# Patient Record
Sex: Female | Born: 2005
Health system: Southern US, Community
[De-identification: ages and names within clinical notes are randomized; demographics above are authoritative.]

## PROBLEM LIST (undated history)

## (undated) DIAGNOSIS — K589 Irritable bowel syndrome without diarrhea: Secondary | ICD-10-CM

## (undated) HISTORY — PX: WISDOM TOOTH EXTRACTION: SHX21

## (undated) HISTORY — PX: NO PAST SURGERIES: SHX2092

---
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
## 2023-02-09 ENCOUNTER — Other Ambulatory Visit: Payer: Self-pay | Admitting: Obstetrics and Gynecology

## 2023-02-26 ENCOUNTER — Telehealth: Payer: Self-pay

## 2023-02-26 ENCOUNTER — Other Ambulatory Visit: Payer: Self-pay | Admitting: Obstetrics and Gynecology

## 2023-02-26 MED ORDER — NORETHINDRONE 0.35 MG PO TABS: 1.0000 | ORAL_TABLET | Freq: Every day | ORAL | 0 refills | Status: DC

## 2023-02-26 NOTE — Telephone Encounter (Signed)
Patient called about OCP refills. Looks like she has an IUD. OCP were recommended for cycle control. Could you please send script to pharmacy.  CB

## 2023-02-26 NOTE — Progress Notes (Signed)
Rx RF POPs for cycle control with IUD

## 2023-02-26 NOTE — Telephone Encounter (Signed)
Rx RF eRxd.  

## 2023-02-27 NOTE — Telephone Encounter (Signed)
Tried both numbers again, no answer, did not leave voice msg this time.

## 2023-02-27 NOTE — Telephone Encounter (Signed)
Called pt on both #'s in chart, no answer, LVMTRC on both #'s.

## 2023-05-13 ENCOUNTER — Telehealth: Payer: Self-pay

## 2023-05-13 NOTE — Telephone Encounter (Signed)
Pt calling; was recently put on a new bcp in addition to her IUD; it was supposed to stop her bleeding; is still having period and bleeding  [phone]

## 2023-05-14 NOTE — Telephone Encounter (Signed)
When first started [facility] pills no periods, then went to beach about 3 months ago she forgot her pills (missed 3 pills) and since then this has been happening. Monthly cycles are lasting 5-6 days. Since those 3 missed pills she has been taking them consistenly every day at 8:00 pm.

## 2023-05-14 NOTE — Telephone Encounter (Signed)
Is her period still lasting 2-3 weeks or is it back to normal length of flow, still monthly?

## 2023-05-14 NOTE — Telephone Encounter (Signed)
Monthly periods with IUD are normal, even with prog only pills. Was trying to stop the 2-3 wk periods. If having monthly periods and no BTB, can stop POPs.

## 2023-05-18 NOTE — Telephone Encounter (Signed)
Called pt, no answer, LVMTRC. 

## 2023-05-19 NOTE — Telephone Encounter (Signed)
Pt aware.

## 2023-07-21 ENCOUNTER — Telehealth: Payer: Self-pay

## 2023-07-21 DIAGNOSIS — N939 Abnormal uterine and vaginal bleeding, unspecified: Secondary | ICD-10-CM

## 2023-07-21 NOTE — Telephone Encounter (Signed)
Options are to remove the IUD and do different BC vs restart POPs to help bleeding with IUD. Had GYN u/s 2/24 and IUD in correct location.

## 2023-07-21 NOTE — Telephone Encounter (Signed)
Pt calling; [facility]'s pt; has IUD; a few months ago she was put on a pill as well d/t excessive bleeding; she stopped the pill 1-2 months ago and the excessive bleeding is back.  What to do?  Adv I will send msg to [facility].  Pharm correct in chart.  [phone]

## 2023-07-22 MED ORDER — NORETHINDRONE 0.35 MG PO TABS: 1.0000 | ORAL_TABLET | Freq: Every day | ORAL | 0 refills | Status: DC

## 2023-07-22 NOTE — Telephone Encounter (Signed)
Pt returned Grecia's call; adv pt of her options per [facility]; pt would like the POP bc pill to be called in like last time.  Pharm correct in chart.

## 2023-07-22 NOTE — Telephone Encounter (Signed)
Called pt, no answer, LVMTRC. 

## 2023-07-23 ENCOUNTER — Other Ambulatory Visit: Payer: Self-pay | Admitting: Obstetrics and Gynecology

## 2023-07-23 DIAGNOSIS — N939 Abnormal uterine and vaginal bleeding, unspecified: Secondary | ICD-10-CM

## 2023-07-23 MED ORDER — NORETHINDRONE 0.35 MG PO TABS: 1.0000 | ORAL_TABLET | Freq: Every day | ORAL | 0 refills | Status: DC

## 2023-07-23 NOTE — Progress Notes (Signed)
Rx RF POPs due to BTB with IUD

## 2023-07-23 NOTE — Telephone Encounter (Signed)
Called pt, no answer, left detailed msg Rx sent and f/u prn.

## 2023-07-23 NOTE — Telephone Encounter (Signed)
Rx RF eRxd. F/u prn

## 2023-08-28 DIAGNOSIS — L700 Acne vulgaris: Secondary | ICD-10-CM | POA: Diagnosis not present

## 2023-08-28 DIAGNOSIS — L74519 Primary focal hyperhidrosis, unspecified: Secondary | ICD-10-CM | POA: Diagnosis not present

## 2023-08-28 DIAGNOSIS — D485 Neoplasm of uncertain behavior of skin: Secondary | ICD-10-CM | POA: Diagnosis not present

## 2023-12-17 ENCOUNTER — Other Ambulatory Visit: Payer: Self-pay | Admitting: Obstetrics and Gynecology

## 2023-12-17 DIAGNOSIS — N939 Abnormal uterine and vaginal bleeding, unspecified: Secondary | ICD-10-CM

## 2023-12-18 DIAGNOSIS — F4322 Adjustment disorder with anxiety: Secondary | ICD-10-CM | POA: Diagnosis not present

## 2023-12-23 MED ORDER — NORETHINDRONE 0.35 MG PO TABS: 1.0000 | ORAL_TABLET | Freq: Every day | ORAL | 0 refills | Status: DC

## 2023-12-23 NOTE — Addendum Note (Signed)
 Addended by: Gloria Lambertson on: 12/23/2023 02:26 PM   Modules accepted: Orders

## 2023-12-23 NOTE — Telephone Encounter (Signed)
 Pt left msg on triage requesting refill on medication to help with her BTB bleeding. Rx RF sent, pt aware.

## 2024-01-06 DIAGNOSIS — F4322 Adjustment disorder with anxiety: Secondary | ICD-10-CM | POA: Diagnosis not present

## 2024-01-13 NOTE — Progress Notes (Unsigned)
 PCP:  Pa, [facility]   No chief complaint on file.    HPI:      [name] is a 18 y.o. G0P0000 whose LMP was No LMP recorded. (Menstrual status: IUD)., presents today for her annual examination.  Her menses are {norm/abn:715}, lasting {number: 22536} days.  Dysmenorrhea {dysmen:716}. She {does:18564} have intermenstrual bleeding. Started on POPs for AUB with [name]  ; neg GYN u/s 2/24 with IUD in correct location  [name]  IUD placed 8/21; menses were monthly, lasting 7 days, mod flow, no clots, no BTB, mild to mod dysmen, improved with NSAIDs/heating pad. Has been bleeding for 2-3 wks last few months, same flow and same dysmen. Menses still seem to be about monthly. No vag sx. Had pelvic pain 12/22 with 3.6 cm simple right ovarian cyst on GYN u/s 12/22.  Sex activity: {sex active: 315163}.  Last Pap: {WUJW:119147829}  Results were: {norm/abn:16707::"no abnormalities"} /neg HPV DNA *** Hx of STDs: {STD hx:14358}  Last mammogram: {date:304500300}  Results were: {norm/abn:13465} There is no FH of breast cancer. There is no FH of ovarian cancer. The patient {does:18564} do self-breast exams.  Tobacco use: {tob:20664} Alcohol use: {Alcohol:11675} No drug use.  Exercise: {exercise:31265}  She {does:18564} get adequate calcium and Vitamin D in her diet.  Patient Active Problem List   Diagnosis Date Noted   Dysmenorrhea in adolescent 04/28/2019    Past Surgical History:  Procedure Laterality Date   NO PAST SURGERIES      Family History  Problem Relation Age of Onset   Hypertension Mother    Melanoma Mother 26   Depression Maternal Grandmother    Cancer Maternal Grandfather     Social History   Socioeconomic History   Marital status: Single    Spouse name: Not on file   Number of children: Not on file   Years of education: Not on file   Highest education level: Not on file  Occupational History   Not on file  Tobacco Use   Smoking status: Never    Smokeless tobacco: Never  Vaping Use   Vaping status: Never Used  Substance and Sexual Activity   Alcohol use: Never   Drug use: Never   Sexual activity: Yes    Birth control/protection: Condom, I.U.D.    Comment: [name]   Other Topics Concern   Not on file  Social History Narrative   Not on file   Social Drivers of Health   Financial Resource Strain: Not on file  Food Insecurity: Not on file  Transportation Needs: Not on file  Physical Activity: Not on file  Stress: Not on file  Social Connections: Not on file  Intimate Partner Violence: Not on file     Current Outpatient Medications:    levonorgestrel  ([name] ) 19.5 MG IUD, 1 Intra Uterine Device (1 each total) by Intrauterine route once for 1 dose., Disp: 1 Intra Uterine Device, Rfl: 0   norethindrone  (MICRONOR ) 0.35 MG tablet, Take 1 tablet (0.35 mg total) by mouth daily., Disp: 84 tablet, Rfl: 0     ROS:  Review of Systems BREAST: No symptoms   Objective: There were no vitals taken for this visit.   OBGyn Exam  Results: No results found for this or any previous visit (from the past 24 hours).  Assessment/Plan: No diagnosis found.  No orders of the defined types were placed in this encounter.            GYN counsel {counseling: 16159}     F/U  No follow-ups on file.  Tyeler Goedken B. Loree Shehata, PA-C 01/13/2024 8:47 PM

## 2024-01-14 ENCOUNTER — Ambulatory Visit (INDEPENDENT_AMBULATORY_CARE_PROVIDER_SITE_OTHER): Admitting: Obstetrics and Gynecology

## 2024-01-14 ENCOUNTER — Other Ambulatory Visit (HOSPITAL_COMMUNITY)
Admission: RE | Admit: 2024-01-14 | Discharge: 2024-01-14 | Disposition: A | Discharge status: Home / Self Care | Source: Ambulatory Visit | Attending: Obstetrics and Gynecology | Admitting: Obstetrics and Gynecology

## 2024-01-14 ENCOUNTER — Encounter: Payer: Self-pay | Admitting: Obstetrics and Gynecology

## 2024-01-14 VITALS — BP 100/68 | HR 70 | Ht 64.0 in | Wt 119.0 lb

## 2024-01-14 DIAGNOSIS — Z3041 Encounter for surveillance of contraceptive pills: Secondary | ICD-10-CM

## 2024-01-14 DIAGNOSIS — N939 Abnormal uterine and vaginal bleeding, unspecified: Secondary | ICD-10-CM

## 2024-01-14 DIAGNOSIS — Z113 Encounter for screening for infections with a predominantly sexual mode of transmission: Secondary | ICD-10-CM

## 2024-01-14 DIAGNOSIS — Z01419 Encounter for gynecological examination (general) (routine) without abnormal findings: Secondary | ICD-10-CM

## 2024-01-14 DIAGNOSIS — Z30431 Encounter for routine checking of intrauterine contraceptive device: Secondary | ICD-10-CM

## 2024-01-14 MED ORDER — NORETHINDRONE 0.35 MG PO TABS: 1.0000 | ORAL_TABLET | Freq: Every day | ORAL | 3 refills | Status: AC

## 2024-01-14 NOTE — Patient Instructions (Signed)
 I value your feedback and you entrusting [place] with your care. If you get a [place] patient survey, I would appreciate you taking the time to let us know about your experience today. Thank you! ? ? ?

## 2024-01-15 LAB — CERVICOVAGINAL ANCILLARY ONLY
Chlamydia: NEGATIVE
Comment: NEGATIVE
Comment: NORMAL
Neisseria Gonorrhea: NEGATIVE

## 2024-01-19 DIAGNOSIS — F4322 Adjustment disorder with anxiety: Secondary | ICD-10-CM | POA: Diagnosis not present

## 2024-02-11 DIAGNOSIS — F4322 Adjustment disorder with anxiety: Secondary | ICD-10-CM | POA: Diagnosis not present

## 2024-04-01 DIAGNOSIS — F4322 Adjustment disorder with anxiety: Secondary | ICD-10-CM | POA: Diagnosis not present

## 2024-05-13 DIAGNOSIS — F4322 Adjustment disorder with anxiety: Secondary | ICD-10-CM | POA: Diagnosis not present

## 2024-06-08 DIAGNOSIS — L700 Acne vulgaris: Secondary | ICD-10-CM | POA: Diagnosis not present

## 2024-06-08 DIAGNOSIS — L710 Perioral dermatitis: Secondary | ICD-10-CM | POA: Diagnosis not present
